# Patient Record
Sex: Female | Born: 1970 | Hispanic: Yes | Marital: Married | Smoking: Never smoker
Health system: Southern US, Community
[De-identification: ages and names within clinical notes are randomized; demographics above are authoritative.]

## PROBLEM LIST (undated history)

## (undated) DIAGNOSIS — M549 Dorsalgia, unspecified: Secondary | ICD-10-CM

## (undated) DIAGNOSIS — A609 Anogenital herpesviral infection, unspecified: Secondary | ICD-10-CM

## (undated) DIAGNOSIS — K219 Gastro-esophageal reflux disease without esophagitis: Secondary | ICD-10-CM

## (undated) DIAGNOSIS — E119 Type 2 diabetes mellitus without complications: Secondary | ICD-10-CM

## (undated) DIAGNOSIS — J329 Chronic sinusitis, unspecified: Secondary | ICD-10-CM

## (undated) DIAGNOSIS — J309 Allergic rhinitis, unspecified: Secondary | ICD-10-CM

## (undated) DIAGNOSIS — F419 Anxiety disorder, unspecified: Secondary | ICD-10-CM

## (undated) DIAGNOSIS — E559 Vitamin D deficiency, unspecified: Secondary | ICD-10-CM

## (undated) DIAGNOSIS — F32A Depression, unspecified: Secondary | ICD-10-CM

## (undated) HISTORY — PX: KNEE SURGERY: SHX244

## (undated) HISTORY — PX: CHOLECYSTECTOMY: SHX55

## (undated) HISTORY — PX: COLONOSCOPY: SHX174

## (undated) HISTORY — PX: FRACTURE SURGERY: SHX138

---

## 2004-07-31 ENCOUNTER — Emergency Department: Payer: Self-pay | Admitting: Emergency Medicine

## 2004-08-02 ENCOUNTER — Ambulatory Visit: Payer: Self-pay | Admitting: General Practice

## 2005-04-27 ENCOUNTER — Emergency Department: Payer: Self-pay | Admitting: Internal Medicine

## 2007-06-05 ENCOUNTER — Ambulatory Visit: Payer: Self-pay | Admitting: Obstetrics and Gynecology

## 2007-06-06 ENCOUNTER — Ambulatory Visit: Payer: Self-pay | Admitting: Obstetrics and Gynecology

## 2007-10-14 ENCOUNTER — Observation Stay: Payer: Self-pay | Admitting: Vascular Surgery

## 2009-02-14 ENCOUNTER — Ambulatory Visit: Payer: Self-pay | Admitting: Gastroenterology

## 2011-03-22 ENCOUNTER — Ambulatory Visit: Payer: Self-pay | Admitting: Obstetrics and Gynecology

## 2011-07-13 ENCOUNTER — Ambulatory Visit: Payer: Self-pay

## 2013-11-10 ENCOUNTER — Ambulatory Visit: Payer: Self-pay | Admitting: Family Medicine

## 2015-02-07 ENCOUNTER — Encounter: Payer: Self-pay | Admitting: *Deleted

## 2015-02-07 NOTE — Discharge Instructions (Signed)

## 2015-02-09 ENCOUNTER — Ambulatory Visit
Admission: RE | Admit: 2015-02-09 | Discharge: 2015-02-09 | Disposition: A | Discharge status: Home / Self Care | Payer: No Typology Code available for payment source | Source: Ambulatory Visit | Attending: Gastroenterology | Admitting: Gastroenterology

## 2015-02-09 ENCOUNTER — Ambulatory Visit: Payer: No Typology Code available for payment source | Admitting: Anesthesiology

## 2015-02-09 ENCOUNTER — Encounter
Admission: RE | Disposition: A | Discharge status: Home / Self Care | Payer: Self-pay | Source: Ambulatory Visit | Attending: Gastroenterology

## 2015-02-09 DIAGNOSIS — K219 Gastro-esophageal reflux disease without esophagitis: Secondary | ICD-10-CM | POA: Insufficient documentation

## 2015-02-09 DIAGNOSIS — F418 Other specified anxiety disorders: Secondary | ICD-10-CM | POA: Insufficient documentation

## 2015-02-09 DIAGNOSIS — Z9049 Acquired absence of other specified parts of digestive tract: Secondary | ICD-10-CM | POA: Diagnosis not present

## 2015-02-09 DIAGNOSIS — J309 Allergic rhinitis, unspecified: Secondary | ICD-10-CM | POA: Diagnosis not present

## 2015-02-09 DIAGNOSIS — Z833 Family history of diabetes mellitus: Secondary | ICD-10-CM | POA: Diagnosis not present

## 2015-02-09 DIAGNOSIS — Z79899 Other long term (current) drug therapy: Secondary | ICD-10-CM | POA: Diagnosis not present

## 2015-02-09 DIAGNOSIS — R1013 Epigastric pain: Secondary | ICD-10-CM | POA: Insufficient documentation

## 2015-02-09 DIAGNOSIS — Z809 Family history of malignant neoplasm, unspecified: Secondary | ICD-10-CM | POA: Diagnosis not present

## 2015-02-09 HISTORY — PX: ESOPHAGOGASTRODUODENOSCOPY: SHX5428

## 2015-02-09 HISTORY — DX: Chronic sinusitis, unspecified: J32.9

## 2015-02-09 HISTORY — DX: Gastro-esophageal reflux disease without esophagitis: K21.9

## 2015-02-09 SURGERY — EGD (ESOPHAGOGASTRODUODENOSCOPY)
Anesthesia: Monitor Anesthesia Care

## 2015-02-09 MED ORDER — LIDOCAINE HCL (CARDIAC) 20 MG/ML IV SOLN
INTRAVENOUS | Status: DC | PRN
Start: 2015-02-09 — End: 2015-02-09
  Administered 2015-02-09: 30 mg via INTRAVENOUS

## 2015-02-09 MED ORDER — ACETAMINOPHEN 325 MG PO TABS: 325.0000 mg | ORAL_TABLET | ORAL | Status: DC | PRN

## 2015-02-09 MED ORDER — ACETAMINOPHEN 160 MG/5ML PO SOLN
325.0000 mg | ORAL | Status: DC | PRN
Start: 2015-02-09 — End: 2015-02-09

## 2015-02-09 MED ORDER — SODIUM CHLORIDE 0.9 % IV SOLN: INTRAVENOUS | Status: DC

## 2015-02-09 MED ORDER — PROPOFOL 10 MG/ML IV BOLUS
INTRAVENOUS | Status: DC | PRN
Start: 2015-02-09 — End: 2015-02-09
  Administered 2015-02-09: 50 mg via INTRAVENOUS
  Administered 2015-02-09: 100 mg via INTRAVENOUS
  Administered 2015-02-09: 10 mg via INTRAVENOUS

## 2015-02-09 MED ORDER — STERILE WATER FOR IRRIGATION IR SOLN
Status: DC | PRN
  Administered 2015-02-09: 11:00:00

## 2015-02-09 MED ORDER — LACTATED RINGERS IV SOLN
INTRAVENOUS | Status: DC
  Administered 2015-02-09: 10:00:00 via INTRAVENOUS

## 2015-02-09 SURGICAL SUPPLY — 41 items
BALLN DILATOR 10-12 8 (BALLOONS)
BALLN DILATOR 12-15 8 (BALLOONS)
BALLN DILATOR 15-18 8 (BALLOONS)
BALLN DILATOR CRE 0-12 8 (BALLOONS)
BALLN DILATOR ESOPH 8 10 CRE (MISCELLANEOUS) IMPLANT
BALLOON DILATOR 12-15 8 (BALLOONS) IMPLANT
BALLOON DILATOR 15-18 8 (BALLOONS) IMPLANT
BALLOON DILATOR CRE 0-12 8 (BALLOONS) IMPLANT
BLOCK BITE 60FR ADLT L/F GRN (MISCELLANEOUS) ×3 IMPLANT
CANISTER SUCT 1200ML W/VALVE (MISCELLANEOUS) ×3 IMPLANT
FCP ESCP3.2XJMB 240X2.8X (MISCELLANEOUS) ×1
FORCEPS BIOP RAD 4 LRG CAP 4 (CUTTING FORCEPS) IMPLANT
FORCEPS BIOP RJ4 240 W/NDL (MISCELLANEOUS) ×2
FORCEPS ESCP3.2XJMB 240X2.8X (MISCELLANEOUS) ×1 IMPLANT
GOWN CVR UNV OPN BCK APRN NK (MISCELLANEOUS) ×1 IMPLANT
GOWN ISOL THUMB LOOP REG UNIV (MISCELLANEOUS) ×2
GOWN STRL REUS W/ TWL LRG LVL3 (GOWN DISPOSABLE) ×1 IMPLANT
GOWN STRL REUS W/TWL LRG LVL3 (GOWN DISPOSABLE) ×2
HEMOCLIP INSTINCT (CLIP) IMPLANT
INJECTOR VARIJECT VIN23 (MISCELLANEOUS) IMPLANT
KIT CO2 TUBING (TUBING) IMPLANT
KIT DEFENDO VALVE AND CONN (KITS) IMPLANT
KIT ENDO PROCEDURE OLY (KITS) ×3 IMPLANT
LIGATOR MULTIBAND 6SHOOTER MBL (MISCELLANEOUS) IMPLANT
MARKER SPOT ENDO TATTOO 5ML (MISCELLANEOUS) IMPLANT
PAD GROUND ADULT SPLIT (MISCELLANEOUS) IMPLANT
SNARE SHORT THROW 13M SML OVAL (MISCELLANEOUS) IMPLANT
SNARE SHORT THROW 30M LRG OVAL (MISCELLANEOUS) IMPLANT
SPOT EX ENDOSCOPIC TATTOO (MISCELLANEOUS)
SUCTION POLY TRAP 4CHAMBER (MISCELLANEOUS) IMPLANT
SYR INFLATION 60ML (SYRINGE) IMPLANT
TRAP SUCTION POLY (MISCELLANEOUS) IMPLANT
TUBING CONN 6MMX3.1M (TUBING)
TUBING SUCTION CONN 0.25 STRL (TUBING) IMPLANT
UNDERPAD 30X60 958B10 (PK) (MISCELLANEOUS) IMPLANT
VALVE BIOPSY ENDO (VALVE) IMPLANT
VARIJECT INJECTOR VIN23 (MISCELLANEOUS)
WATER AUXILLARY (MISCELLANEOUS) IMPLANT
WATER STERILE IRR 250ML POUR (IV SOLUTION) ×3 IMPLANT
WATER STERILE IRR 500ML POUR (IV SOLUTION) IMPLANT
WIRE CRE 18-20MM 8CM F G (MISCELLANEOUS) IMPLANT

## 2015-02-09 NOTE — Anesthesia Procedure Notes (Signed)
Procedure Name: MAC Performed by: Arlice ColtBURNETT, Kemi Gell Pre-anesthesia Checklist: Patient identified, Emergency Drugs available, Suction available, Patient being monitored and Timeout performed Patient Re-evaluated:Patient Re-evaluated prior to inductionOxygen Delivery Method: Nasal cannula Preoxygenation: Pre-oxygenation with 100% oxygen Placement Confirmation: positive ETCO2 and breath sounds checked- equal and bilateral

## 2015-02-09 NOTE — H&P (Signed)
  Date of Initial H&P: 02/03/2015  History reviewed, patient examined, no change in status, stable for surgery. 

## 2015-02-09 NOTE — Op Note (Signed)
[facility] Gastroenterology Patient Name: Breanna Dennis Procedure Date: 02/09/2015 10:40 AM MRN: [id number] Account #: [id number] Date of Birth: 03/04/1971 Admit Type: Outpatient Age: 44 Room: [facility] OR ROOM 01 Gender: Female Note Status: Finalized Procedure:         Upper GI endoscopy Indications:       Epigastric abdominal pain Providers:         Ezzard Standing. Bluford Kaufmann, MD Medicines:         Monitored Anesthesia Care Complications:     No immediate complications. Procedure:         Pre-Anesthesia Assessment:                    - Prior to the procedure, a History and Physical was                     performed, and patient medications, allergies and                     sensitivities were reviewed. The patient's tolerance of                     previous anesthesia was reviewed.                    - The risks and benefits of the procedure and the sedation                     options and risks were discussed with the patient. All                     questions were answered and informed consent was obtained.                    - After reviewing the risks and benefits, the patient was                     deemed in satisfactory condition to undergo the procedure.                    After obtaining informed consent, the endoscope was passed                     under direct vision. Throughout the procedure, the                     patient's blood pressure, pulse, and oxygen saturations                     were monitored continuously. The was introduced through                     the mouth, and advanced to the second part of duodenum.                     The upper GI endoscopy was accomplished without                     difficulty. The patient tolerated the procedure well. Findings:      The examined esophagus was normal.      The entire examined stomach was normal. Biopsies were taken with a cold       forceps for histology.      The examined duodenum was normal. Impression:         -  Normal esophagus.                    - Normal stomach. Biopsied.                    - Normal examined duodenum. Recommendation:    - Discharge patient to home.                    - Observe patient's clinical course.                    - Continue present medications.                    - Await pathology results.                    - The findings and recommendations were discussed with the                     patient. Procedure Code(s): --- Professional ---                    [id number], Esophagogastroduodenoscopy, flexible, transoral;                     with biopsy, single or multiple Diagnosis Code(s): --- Professional ---                    R10.13, Epigastric pain CPT copyright 2014 American Medical Association. All rights reserved. The codes documented in this report are preliminary and upon coder review may  be revised to meet current compliance requirements. Wallace CullensPaul Y Aasiya Creasey, MD 02/09/2015 11:01:34 AM This report has been signed electronically. Number of Addenda: 0 Note Initiated On: 02/09/2015 10:40 AM      [facility]

## 2015-02-09 NOTE — Transfer of Care (Signed)
Immediate Anesthesia Transfer of Care Note  Patient: Breanna [facility]unneryMaria Dennis [facility]  Procedure(s) Performed: Procedure(s) with comments: ESOPHAGOGASTRODUODENOSCOPY (EGD) (N/A) - Spanish interpreter interpreter has been requested  Patient Location: PACU  Anesthesia Type: MAC  Level of Consciousness: awake, alert  and patient cooperative  Airway and Oxygen Therapy: Patient Spontanous Breathing and Patient connected to supplemental oxygen  Post-op Assessment: Post-op Vital signs reviewed, Patient's Cardiovascular Status Stable, Respiratory Function Stable, Patent Airway and No signs of Nausea or vomiting  Post-op Vital Signs: Reviewed and stable  Complications: No apparent anesthesia complications

## 2015-02-09 NOTE — Anesthesia Postprocedure Evaluation (Signed)
  Anesthesia Post-op Note  Patient: [facility]Maria [facility]  Procedure(s) Performed: Procedure(s) with comments: ESOPHAGOGASTRODUODENOSCOPY (EGD) (N/A) - Spanish interpreter interpreter has been requested  Anesthesia type:MAC  Patient location: PACU  Post pain: Pain level controlled  Post assessment: Post-op Vital signs reviewed, Patient's Cardiovascular Status Stable, Respiratory Function Stable, Patent Airway and No signs of Nausea or vomiting  Post vital signs: Reviewed and stable  Last Vitals:  Filed Vitals:   02/09/15 1107  BP: 110/78  Pulse: 69  Temp: 36.5 C  Resp: 18    Level of consciousness: awake, alert  and patient cooperative  Complications: No apparent anesthesia complications

## 2015-02-09 NOTE — Anesthesia Preprocedure Evaluation (Signed)
Anesthesia Evaluation  Patient identified by MRN, date of birth, ID band  Reviewed: Allergy & Precautions, H&P , NPO status , Patient's Chart, lab work & pertinent test results  Airway Mallampati: II  TM Distance: >3 FB Neck ROM: full    Dental no notable dental hx.    Pulmonary    Pulmonary exam normal        Cardiovascular  Rhythm:regular Rate:Normal     Neuro/Psych    GI/Hepatic GERD  ,  Endo/Other    Renal/GU      Musculoskeletal   Abdominal   Peds  Hematology   Anesthesia Other Findings   Reproductive/Obstetrics                             Anesthesia Physical Anesthesia Plan  ASA: II  Anesthesia Plan: MAC   Post-op Pain Management:    Induction:   Airway Management Planned:   Additional Equipment:   Intra-op Plan:   Post-operative Plan:   Informed Consent: I have reviewed the patients History and Physical, chart, labs and discussed the procedure including the risks, benefits and alternatives for the proposed anesthesia with the patient or authorized representative who has indicated his/her understanding and acceptance.     Plan Discussed with: CRNA  Anesthesia Plan Comments:         Anesthesia Quick Evaluation  

## 2015-02-10 ENCOUNTER — Encounter: Payer: Self-pay | Admitting: Gastroenterology

## 2015-02-11 LAB — SURGICAL PATHOLOGY

## 2015-04-05 ENCOUNTER — Other Ambulatory Visit: Payer: Self-pay | Admitting: Nurse Practitioner

## 2015-04-05 DIAGNOSIS — R1013 Epigastric pain: Secondary | ICD-10-CM

## 2015-04-08 ENCOUNTER — Ambulatory Visit: Admission: RE | Admit: 2015-04-08 | Payer: Self-pay | Source: Ambulatory Visit

## 2015-04-14 ENCOUNTER — Ambulatory Visit
Admission: RE | Admit: 2015-04-14 | Discharge: 2015-04-14 | Disposition: A | Discharge status: Home / Self Care | Payer: Commercial Managed Care - HMO | Source: Ambulatory Visit | Attending: Nurse Practitioner | Admitting: Nurse Practitioner

## 2015-04-14 DIAGNOSIS — K219 Gastro-esophageal reflux disease without esophagitis: Secondary | ICD-10-CM | POA: Insufficient documentation

## 2015-04-14 DIAGNOSIS — R1013 Epigastric pain: Secondary | ICD-10-CM | POA: Diagnosis not present

## 2015-04-14 DIAGNOSIS — R935 Abnormal findings on diagnostic imaging of other abdominal regions, including retroperitoneum: Secondary | ICD-10-CM | POA: Insufficient documentation

## 2015-04-14 MED ORDER — IOHEXOL 300 MG/ML  SOLN
100.0000 mL | Freq: Once | INTRAMUSCULAR | Status: AC | PRN
  Administered 2015-04-14: 85 mL via INTRAVENOUS

## 2018-09-24 ENCOUNTER — Other Ambulatory Visit: Payer: Self-pay | Admitting: Certified Nurse Midwife

## 2018-09-24 DIAGNOSIS — Z1231 Encounter for screening mammogram for malignant neoplasm of breast: Secondary | ICD-10-CM

## 2018-10-04 ENCOUNTER — Other Ambulatory Visit: Payer: Self-pay

## 2018-10-04 ENCOUNTER — Emergency Department
Admission: EM | Admit: 2018-10-04 | Discharge: 2018-10-04 | Disposition: A | Discharge status: Home / Self Care | Payer: BLUE CROSS/BLUE SHIELD | Attending: Emergency Medicine | Admitting: Emergency Medicine

## 2018-10-04 ENCOUNTER — Emergency Department: Payer: BLUE CROSS/BLUE SHIELD

## 2018-10-04 ENCOUNTER — Encounter: Payer: Self-pay | Admitting: Emergency Medicine

## 2018-10-04 DIAGNOSIS — M25551 Pain in right hip: Secondary | ICD-10-CM

## 2018-10-04 DIAGNOSIS — M25552 Pain in left hip: Secondary | ICD-10-CM | POA: Diagnosis not present

## 2018-10-04 DIAGNOSIS — Y929 Unspecified place or not applicable: Secondary | ICD-10-CM | POA: Insufficient documentation

## 2018-10-04 DIAGNOSIS — Z79899 Other long term (current) drug therapy: Secondary | ICD-10-CM | POA: Insufficient documentation

## 2018-10-04 DIAGNOSIS — Y9301 Activity, walking, marching and hiking: Secondary | ICD-10-CM | POA: Insufficient documentation

## 2018-10-04 DIAGNOSIS — Y998 Other external cause status: Secondary | ICD-10-CM | POA: Diagnosis not present

## 2018-10-04 DIAGNOSIS — W010XXA Fall on same level from slipping, tripping and stumbling without subsequent striking against object, initial encounter: Secondary | ICD-10-CM | POA: Diagnosis not present

## 2018-10-04 DIAGNOSIS — M545 Low back pain, unspecified: Secondary | ICD-10-CM

## 2018-10-04 LAB — POCT PREGNANCY, URINE: Preg Test, Ur: NEGATIVE

## 2018-10-04 MED ORDER — ORPHENADRINE CITRATE 30 MG/ML IJ SOLN
60.0000 mg | Freq: Two times a day (BID) | INTRAMUSCULAR | Status: DC
  Administered 2018-10-04: 60 mg via INTRAMUSCULAR
  Filled 2018-10-04: qty 2

## 2018-10-04 MED ORDER — LIDOCAINE 5 % EX PTCH
1.0000 | MEDICATED_PATCH | CUTANEOUS | Status: DC
  Administered 2018-10-04: 1 via TRANSDERMAL
  Filled 2018-10-04: qty 1

## 2018-10-04 MED ORDER — TRAMADOL HCL 50 MG PO TABS: 50.0000 mg | ORAL_TABLET | Freq: Four times a day (QID) | ORAL | 0 refills | Status: AC | PRN

## 2018-10-04 MED ORDER — IBUPROFEN 600 MG PO TABS: 600.0000 mg | ORAL_TABLET | Freq: Three times a day (TID) | ORAL | 0 refills | Status: AC | PRN

## 2018-10-04 MED ORDER — HYDROMORPHONE HCL 1 MG/ML IJ SOLN
1.0000 mg | Freq: Once | INTRAMUSCULAR | Status: AC
  Administered 2018-10-04: 1 mg via INTRAMUSCULAR
  Filled 2018-10-04: qty 1

## 2018-10-04 MED ORDER — CYCLOBENZAPRINE HCL 10 MG PO TABS: 10.0000 mg | ORAL_TABLET | Freq: Three times a day (TID) | ORAL | 0 refills | Status: AC | PRN

## 2018-10-04 NOTE — ED Provider Notes (Signed)
[facility] Emergency Department Provider Note   ____________________________________________   First MD Initiated Contact with Patient 10/04/18 1446     (approximate)  I have reviewed the triage vital signs and the nursing notes.   HISTORY  Chief Complaint Back Pain    HPI: Via interpreter Breanna Dennis Breanna Dennis is a 48 y.o. female patient presents with acute back pain secondary to a slip and fall.  Patient states pain in bilateral hips and coccyx area.  Patient denies radicular component to her back pain.  Patient denies bladder bowel dysfunction.  Patient rates the pain is 8/10.  Patient appears to be in moderate distress.  No palliative measures for complaint.         Past Medical History:  Diagnosis Date  . GERD (gastroesophageal reflux disease)   . Sinusitis     There are no active problems to display for this patient.   Past Surgical History:  Procedure Laterality Date  . CHOLECYSTECTOMY    . COLONOSCOPY    . ESOPHAGOGASTRODUODENOSCOPY N/A 02/09/2015   Procedure: ESOPHAGOGASTRODUODENOSCOPY (EGD);  Surgeon: Hulen Luster, MD;  Location: [place];  Service: Gastroenterology;  Laterality: N/A;  Spanish interpreter interpreter has been requested  . FRACTURE SURGERY      Prior to Admission medications   Medication Sig Start Date End Date Taking? Authorizing Provider  cyclobenzaprine (FLEXERIL) 10 MG tablet Take 1 tablet (10 mg total) by mouth 3 (three) times daily as needed. 10/04/18   Sable Feil, PA-C  ibuprofen (ADVIL) 600 MG tablet Take 1 tablet (600 mg total) by mouth every 8 (eight) hours as needed. 10/04/18   Sable Feil, PA-C  Multiple Vitamin (MULTIVITAMIN) tablet Take 1 tablet by mouth daily.    [provider]  omeprazole (PRILOSEC) 20 MG capsule Take 20 mg by mouth daily. Lunch time    [provider]  Probiotic Product (PROBIOTIC DAILY PO) Take by mouth.    [provider]  traMADol  (ULTRAM) 50 MG tablet Take 1 tablet (50 mg total) by mouth every 6 (six) hours as needed. 10/04/18 10/04/19  Sable Feil, PA-C    Allergies Patient has no known allergies.  No family history on file.  Social History Social History   Tobacco Use  . Smoking status: Never Smoker  Substance Use Topics  . Alcohol use: No  . Drug use: Not on file    Review of Systems Constitutional: No fever/chills Eyes: No visual changes. ENT: No sore throat. Cardiovascular: Denies chest pain. Respiratory: Denies shortness of breath. Gastrointestinal: No abdominal pain.  No nausea, no vomiting.  No diarrhea.  No constipation. Genitourinary: Negative for dysuria. Musculoskeletal: Positive for back and hip pain Skin: Negative for rash. Neurological: Negative for headaches, focal weakness or numbness.  ____________________________________________   PHYSICAL EXAM:  VITAL SIGNS: ED Triage Vitals  Enc Vitals Group     BP 10/04/18 1323 125/78     Pulse Rate 10/04/18 1323 66     Resp 10/04/18 1323 18     Temp 10/04/18 1323 98.5 F (36.9 C)     Temp Source 10/04/18 1323 Oral     SpO2 10/04/18 1323 97 %     Weight 10/04/18 1326 180 lb (81.6 kg)     Height 10/04/18 1326 5\' 5"  (1.651 m)     Head Circumference --      Peak Flow --      Pain Score 10/04/18 1325 8  Pain Loc --      Pain Edu? --      Excl. in GC? --     Constitutional: Alert and oriented.  Moderate distress.   Neck: No stridor.  No cervical spine tenderness to palpation. Hematological/Lymphatic/Immunilogical: No cervical lymphadenopathy. Cardiovascular: Normal rate, regular rhythm. Grossly normal heart sounds.  Good peripheral circulation. Respiratory: Normal respiratory effort.  No retractions. Lungs CTAB. Gastrointestinal: Soft and nontender. No distention. No abdominal bruits. No CVA tenderness. Genitourinary: Deferred Musculoskeletal: No obvious spinal deformity.  Moderate guarding palpation of L3-S1.   Neurologic:   Normal speech and language. No gross focal neurologic deficits are appreciated. No gait instability. Skin:  Skin is warm, dry and intact. No rash noted. Psychiatric: Mood and affect are normal. Speech and behavior are normal.  ____________________________________________   LABS (all labs ordered are listed, but only abnormal results are displayed)  Labs Reviewed  POC URINE PREG, ED  POCT PREGNANCY, URINE   ____________________________________________  EKG   ____________________________________________  RADIOLOGY  ED MD interpretation:    Official radiology report(s): Dg Lumbar Spine 2-3 Views  Result Date: 10/04/2018 CLINICAL DATA:  Low back pain following fall, initial encounter EXAM: LUMBAR SPINE - 3 VIEW COMPARISON:  None. FINDINGS: Five lumbar type vertebral bodies are well visualized. Vertebral body height is well maintained. Mild osteophytic changes are seen. No anterolisthesis is noted. No soft tissue changes are seen. IMPRESSION: Mild degenerative change without acute abnormality. Electronically Signed   By: Alcide CleverMark  Lukens M.D.   On: 10/04/2018 16:26   Dg Hip Unilat W Or Wo Pelvis 2-3 Views Left  Result Date: 10/04/2018 CLINICAL DATA:  Left hip pain following fall, initial encounter EXAM: DG HIP (WITH OR WITHOUT PELVIS) 3V LEFT COMPARISON:  None. FINDINGS: Pelvic ring is intact. No acute fracture or dislocation is noted. No soft tissue abnormality is seen. IMPRESSION: No acute abnormality noted. Electronically Signed   By: Alcide CleverMark  Lukens M.D.   On: 10/04/2018 16:32   Dg Hip Unilat W Or Wo Pelvis 2-3 Views Right  Result Date: 10/04/2018 CLINICAL DATA:  Recent fall with right hip pain, initial encounter EXAM: DG HIP (WITH OR WITHOUT PELVIS) 3V RIGHT COMPARISON:  None. FINDINGS: Pelvic ring as visualized is within normal limits. No acute fracture or dislocation is seen. No soft tissue abnormality is noted. IMPRESSION: No acute abnormality noted. Electronically Signed   By: Alcide CleverMark   Lukens M.D.   On: 10/04/2018 16:28    ____________________________________________   PROCEDURES  Procedure(s) performed (including Critical Care):  Procedures   ____________________________________________   INITIAL IMPRESSION / ASSESSMENT AND PLAN / ED COURSE  As part of my medical decision making, I reviewed the following data within the electronic MEDICAL RECORD NUMBER         Breanna Dennis was evaluated in Emergency Department on 10/04/2018 for the symptoms described in the history of present illness. She was evaluated in the context of the global COVID-19 pandemic, which necessitated consideration that the patient might be at risk for infection with the SARS-CoV-2 virus that causes COVID-19. Institutional protocols and algorithms that pertain to the evaluation of patients at risk for COVID-19 are in a state of rapid change based on information released by regulatory bodies including the CDC and federal and state organizations. These policies and algorithms were followed during the patient's care in the ED.   Patient complain of low back pain secondary to fall.  Further evaluation with x-rays shows no acute abnormalities.  Patient given discharge care  instructions advised take medication as directed.  [facility]      ____________________________________________   FINAL CLINICAL IMPRESSION(S) / ED DIAGNOSES  Final diagnoses:  Acute midline low back pain without sciatica  Pain of both hip joints     ED Discharge Orders         Ordered    traMADol (ULTRAM) 50 MG tablet  Every 6 hours PRN     10/04/18 1640    cyclobenzaprine (FLEXERIL) 10 MG tablet  3 times daily PRN     10/04/18 1640    ibuprofen (ADVIL) 600 MG tablet  Every 8 hours PRN     10/04/18 1640           Note:  This document was prepared using Dragon voice recognition software and may include unintentional dictation errors.    Joni ReiningSmith, Shirlette Scarber  K, PA-C 10/04/18 1653    Shaune PollackIsaacs, [place], MD 10/05/18 [phone]

## 2018-10-04 NOTE — ED Triage Notes (Signed)
Low back pain since fall this am.

## 2018-10-04 NOTE — Discharge Instructions (Addendum)
Follow discharge care instruction take medication as directed. °

## 2018-11-03 ENCOUNTER — Ambulatory Visit
Admission: RE | Admit: 2018-11-03 | Discharge: 2018-11-03 | Disposition: A | Discharge status: Home / Self Care | Payer: BLUE CROSS/BLUE SHIELD | Source: Ambulatory Visit | Attending: Certified Nurse Midwife | Admitting: Certified Nurse Midwife

## 2018-11-03 DIAGNOSIS — Z1231 Encounter for screening mammogram for malignant neoplasm of breast: Secondary | ICD-10-CM | POA: Insufficient documentation

## 2018-11-10 ENCOUNTER — Other Ambulatory Visit: Payer: Self-pay | Admitting: Certified Nurse Midwife

## 2018-11-10 DIAGNOSIS — R921 Mammographic calcification found on diagnostic imaging of breast: Secondary | ICD-10-CM

## 2018-11-10 DIAGNOSIS — R928 Other abnormal and inconclusive findings on diagnostic imaging of breast: Secondary | ICD-10-CM

## 2019-01-06 ENCOUNTER — Ambulatory Visit
Admission: RE | Admit: 2019-01-06 | Discharge: 2019-01-06 | Disposition: A | Discharge status: Home / Self Care | Payer: BLUE CROSS/BLUE SHIELD | Source: Ambulatory Visit | Attending: Certified Nurse Midwife | Admitting: Certified Nurse Midwife

## 2019-01-06 DIAGNOSIS — R921 Mammographic calcification found on diagnostic imaging of breast: Secondary | ICD-10-CM

## 2019-01-06 DIAGNOSIS — R928 Other abnormal and inconclusive findings on diagnostic imaging of breast: Secondary | ICD-10-CM | POA: Diagnosis not present

## 2019-03-12 ENCOUNTER — Other Ambulatory Visit: Payer: Self-pay | Admitting: Gerontology

## 2019-03-12 DIAGNOSIS — N930 Postcoital and contact bleeding: Secondary | ICD-10-CM

## 2019-03-18 ENCOUNTER — Ambulatory Visit
Admission: RE | Admit: 2019-03-18 | Discharge: 2019-03-18 | Disposition: A | Discharge status: Home / Self Care | Payer: BLUE CROSS/BLUE SHIELD | Source: Ambulatory Visit | Attending: Gerontology | Admitting: Gerontology

## 2019-03-18 ENCOUNTER — Other Ambulatory Visit: Payer: Self-pay

## 2019-03-18 DIAGNOSIS — N930 Postcoital and contact bleeding: Secondary | ICD-10-CM | POA: Diagnosis present

## 2019-10-19 IMAGING — MG DIGITAL DIAGNOSTIC UNILAT LEFT W/ CAD
4 series · 4 of 8 positions shown · non-contrast
Comparison: Previous exam(s).

CLINICAL DATA: Recall from screening mammogram for questioned
calcifications in the left breast

EXAM:
DIGITAL DIAGNOSTIC LEFT MAMMOGRAM WITH TOMO

[L ML]
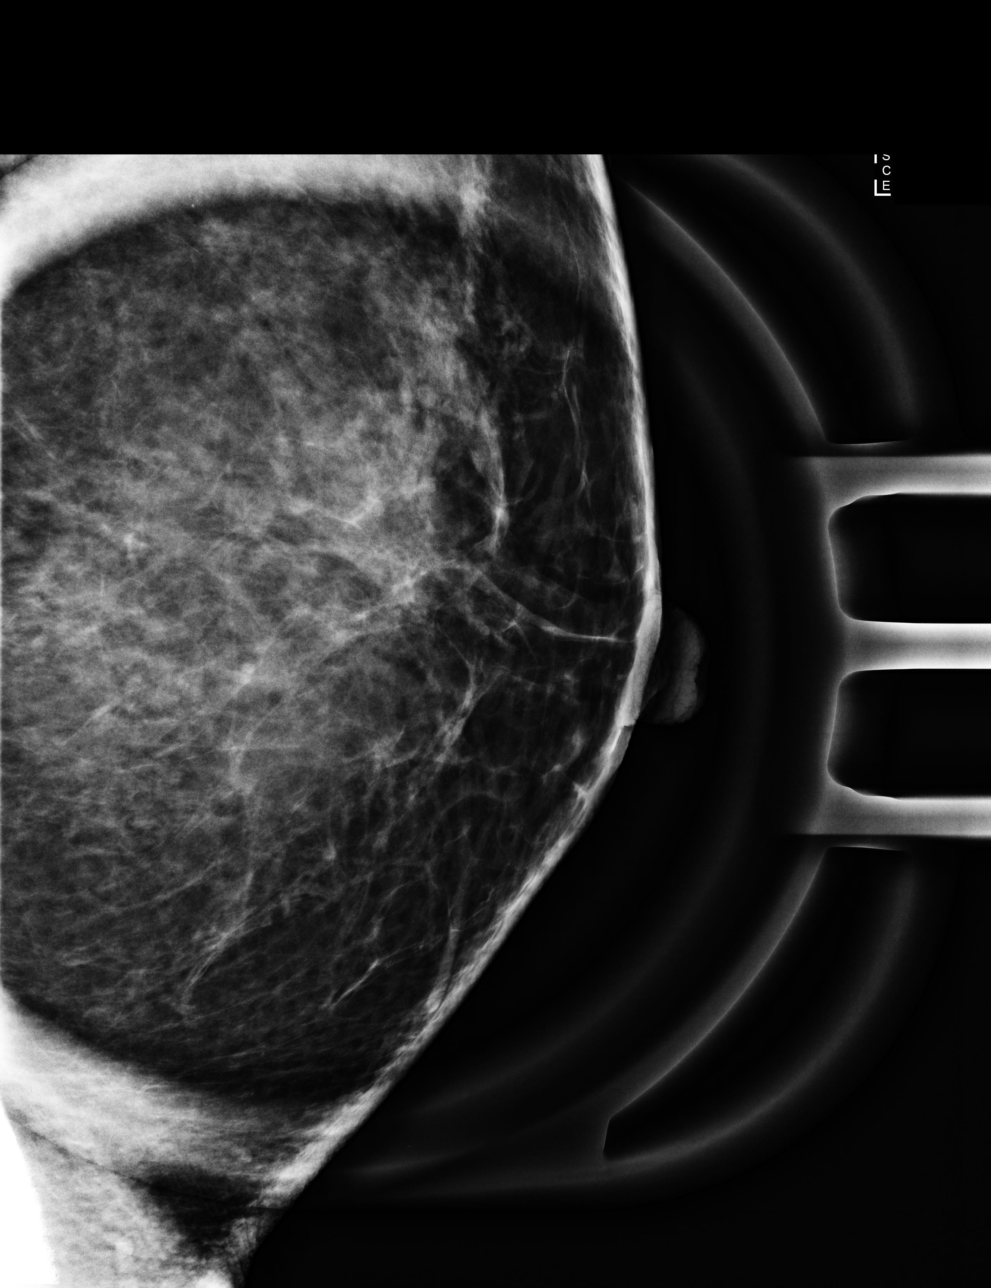

[L CC]
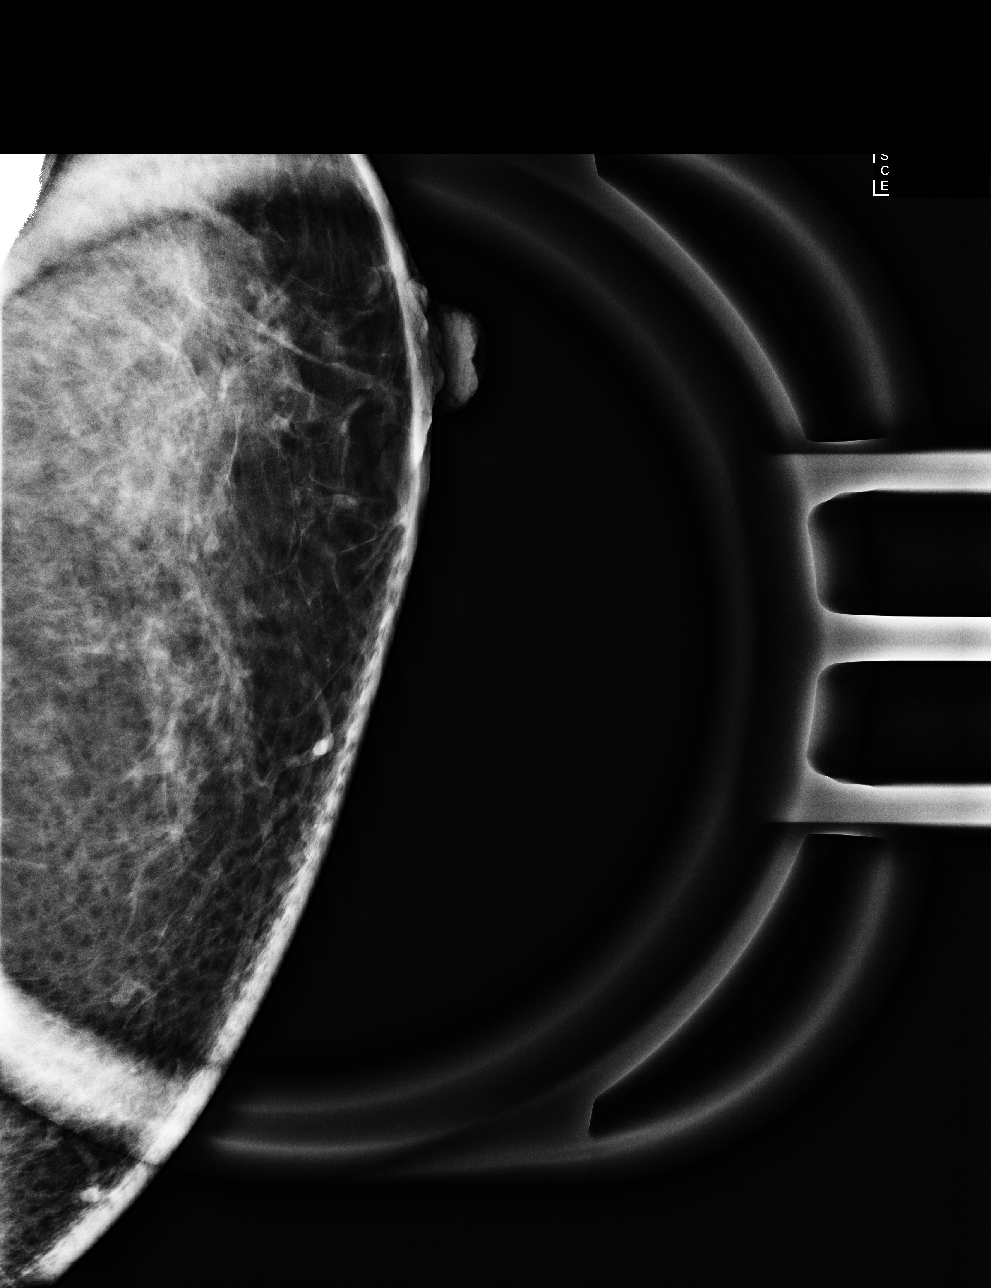

[L ML synth-2D]
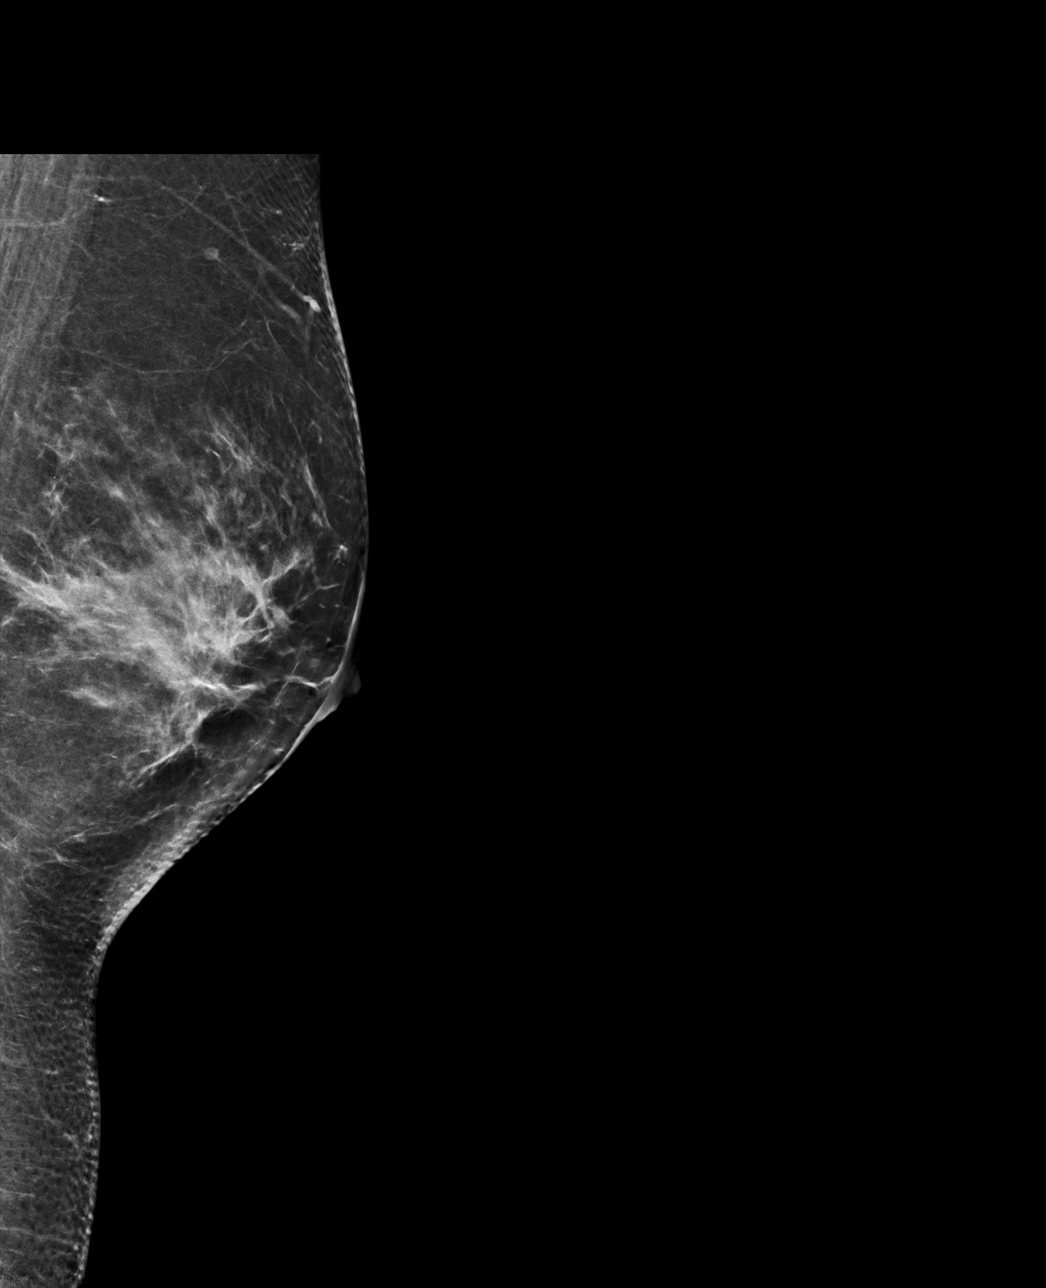

[L ML tomo · tomo slice 39/76.0]
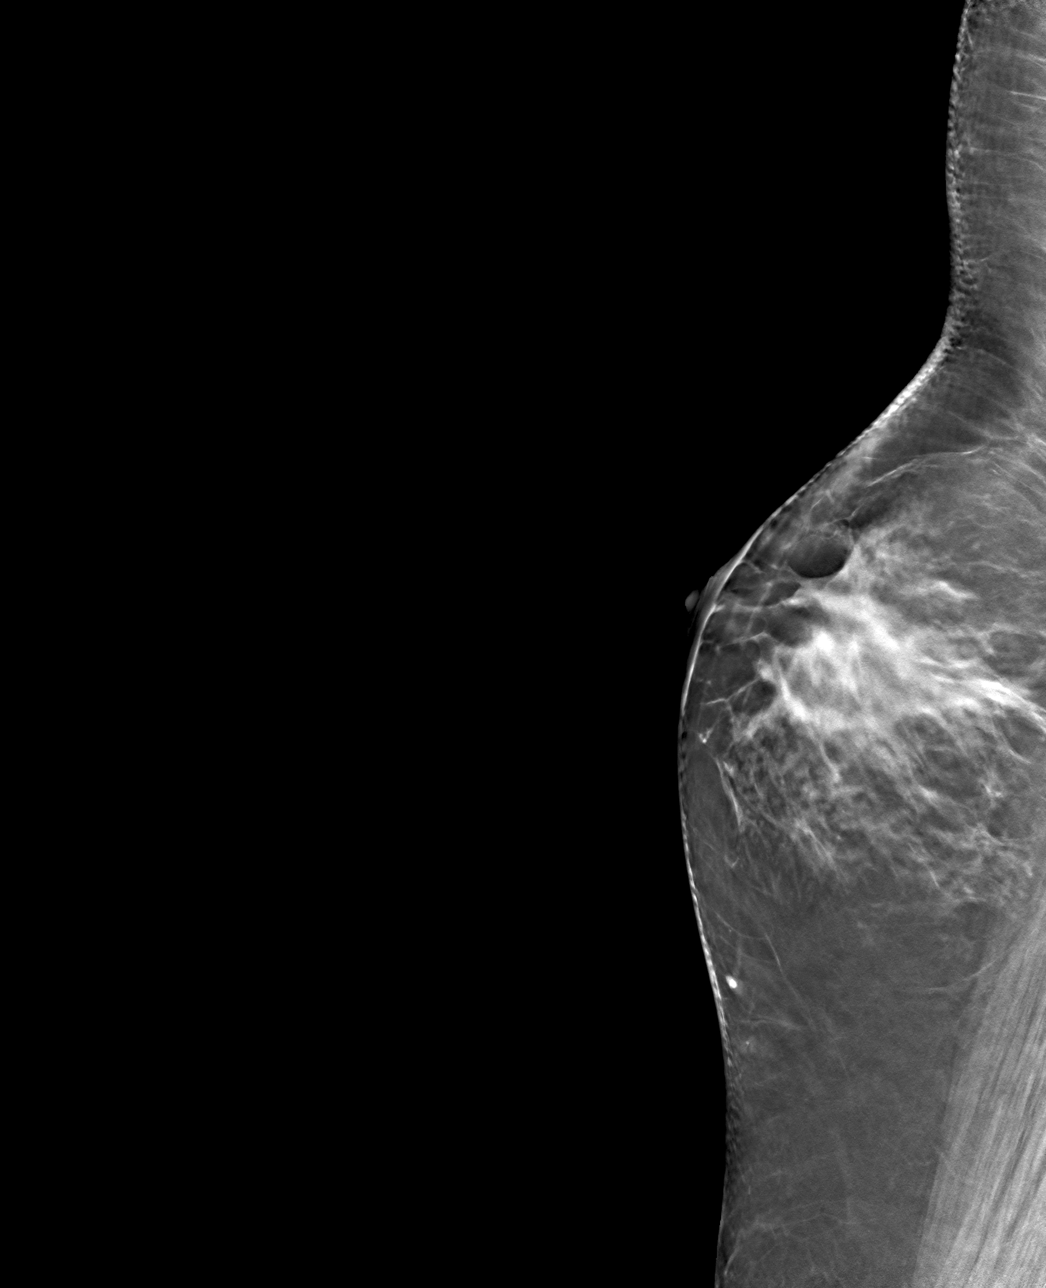

[4 of 8 positions shown; findings below may reference images not displayed]

ACR Breast Density Category c: The breast tissue is heterogeneously
dense, which may obscure small masses.
FINDINGS: Additional views including magnification do not demonstrate any
suspicious microcalcifications in the left breast. No suspicious
mass, microcalcification, or other finding is identified.
IMPRESSION: No evidence of malignancy in the left breast.

RECOMMENDATION:
Recommend routine annual screening mammogram in October 2019.

I have discussed the findings and recommendations with the patient.
If applicable, a reminder letter will be sent to the patient
regarding the next appointment.

BI-RADS CATEGORY  1: Negative.

## 2019-12-29 IMAGING — US US PELVIS COMPLETE WITH TRANSVAGINAL
1 series · 14 of 24 positions shown · non-contrast
Comparison: None

CLINICAL DATA: Bleeding after intercourse; last menstrual period
was in 8608



[Series 1: us pelvis complete with transvaginal · 14 of 24 slices shown]
[im 1/24]
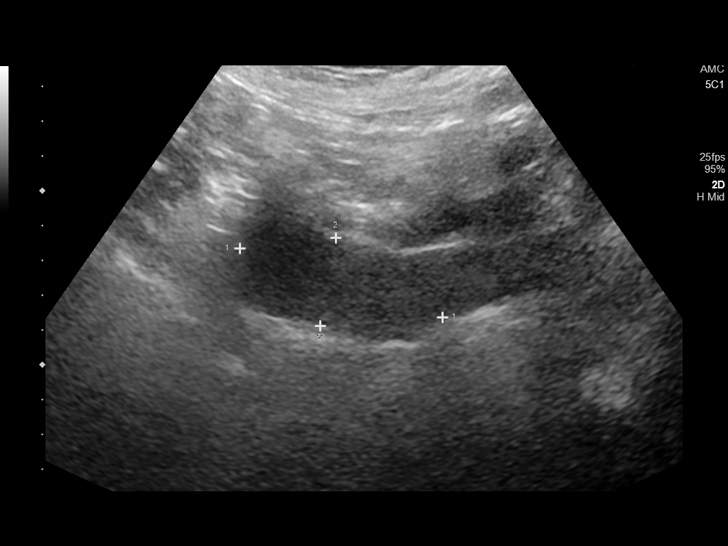
[im 3/24]
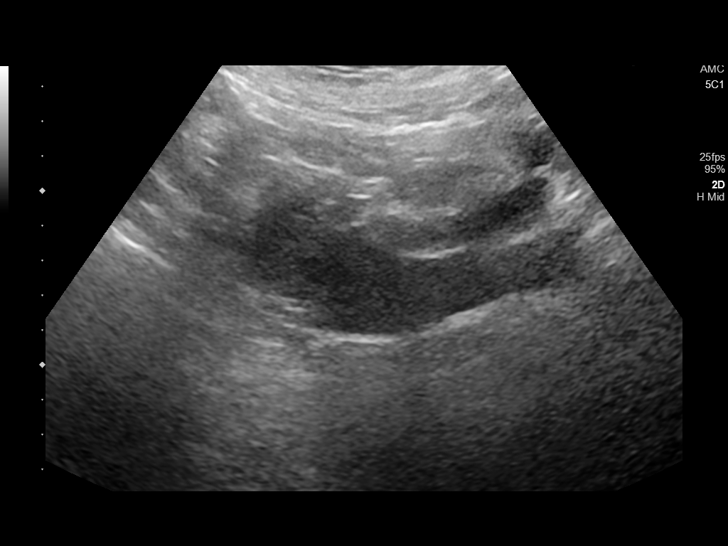
[im 5/24]
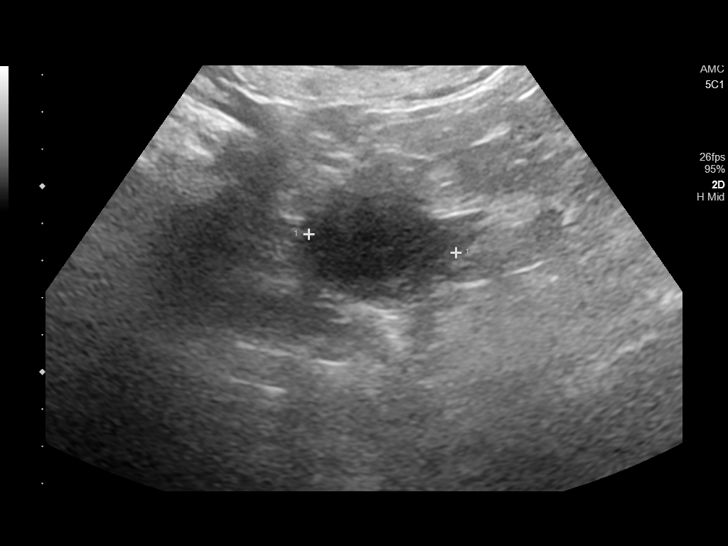
[im 7/24]
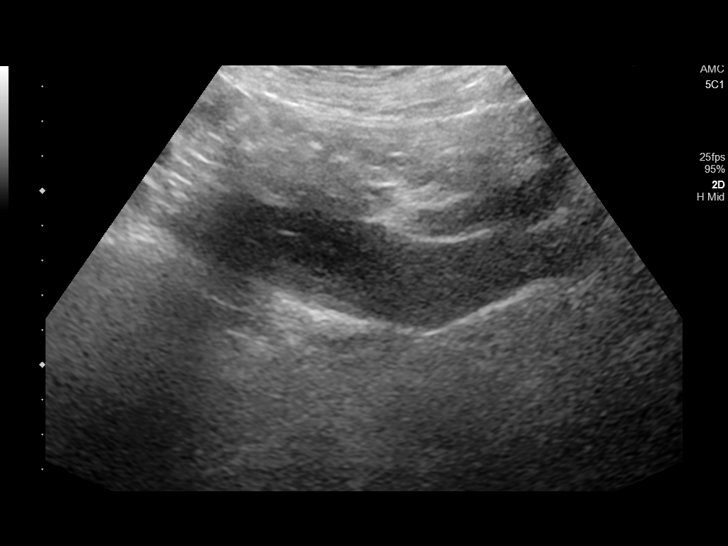
[im 8/24]
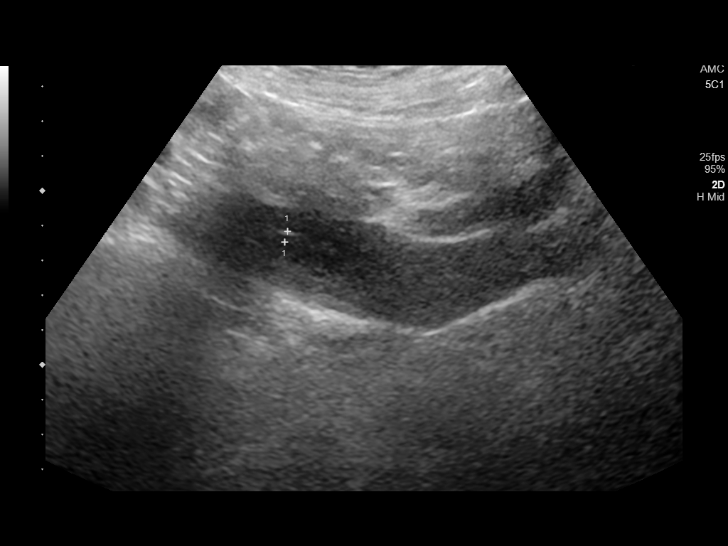
[im 10/24]
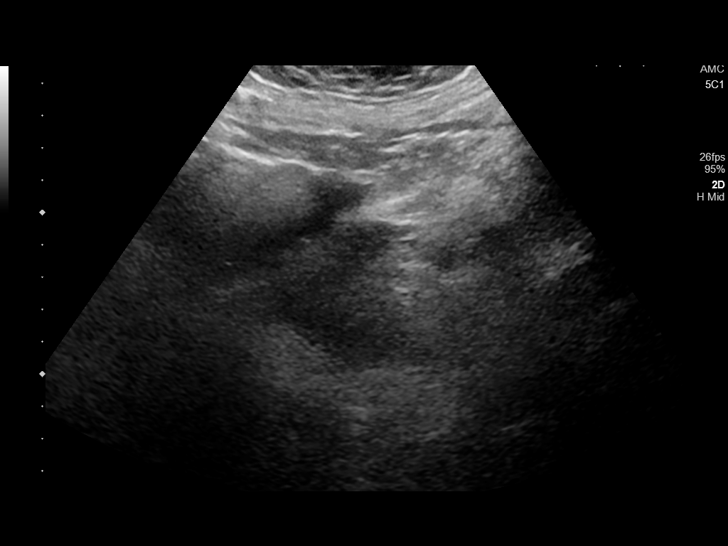
[im 12/24]
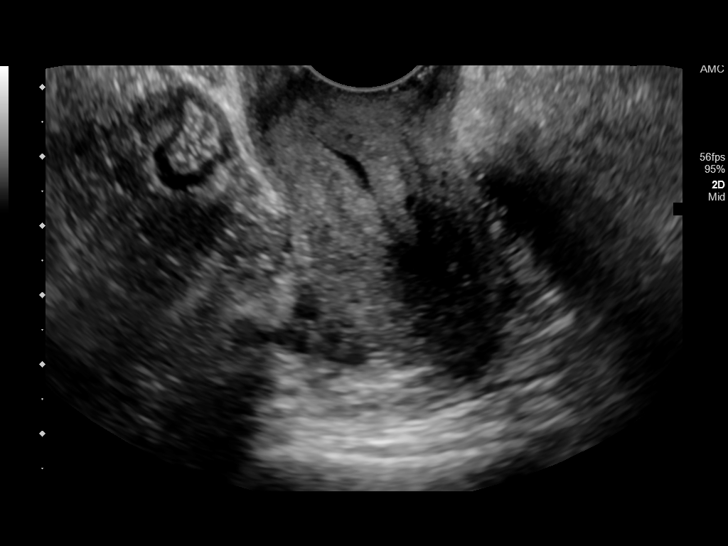
[im 13/24]
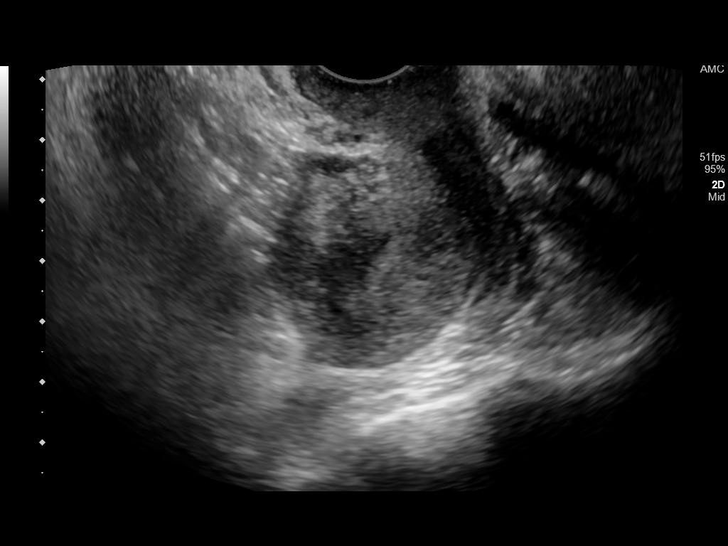
[im 15/24]
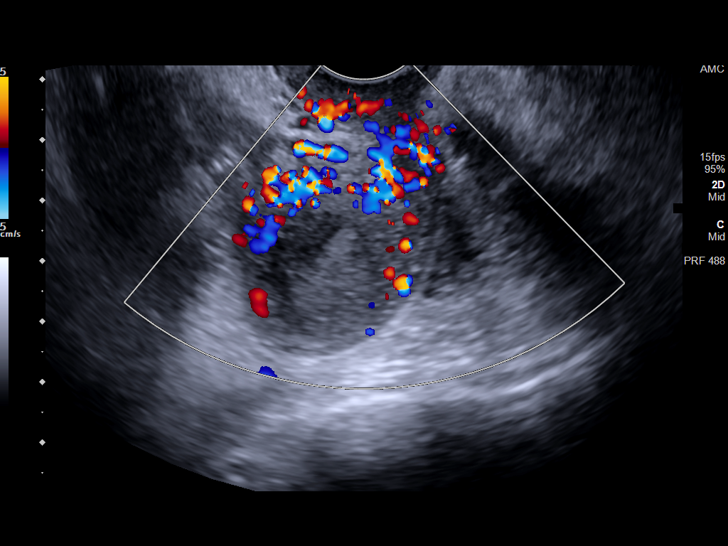
[im 17/24]
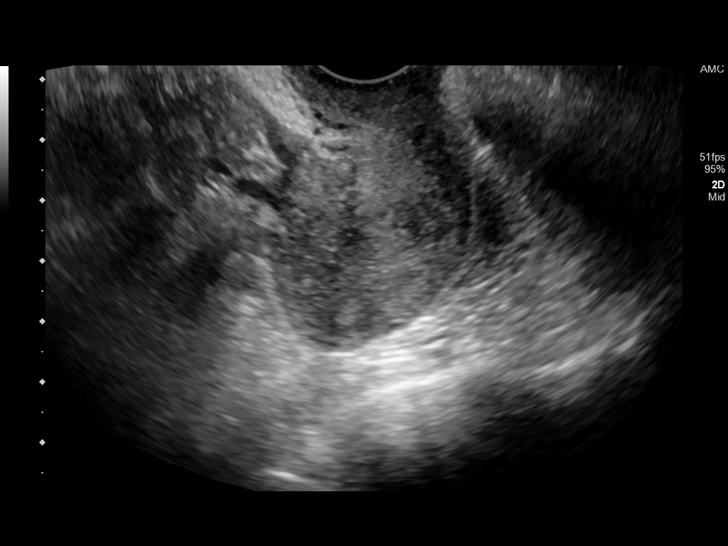
[im 19/24]
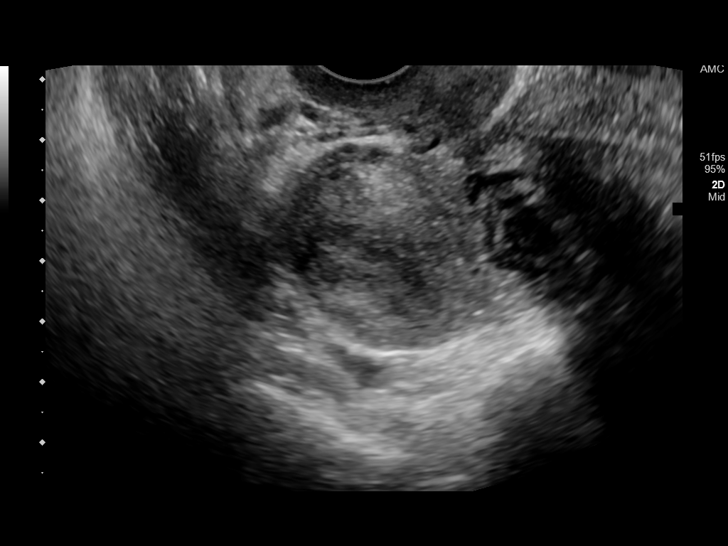
[im 20/24]
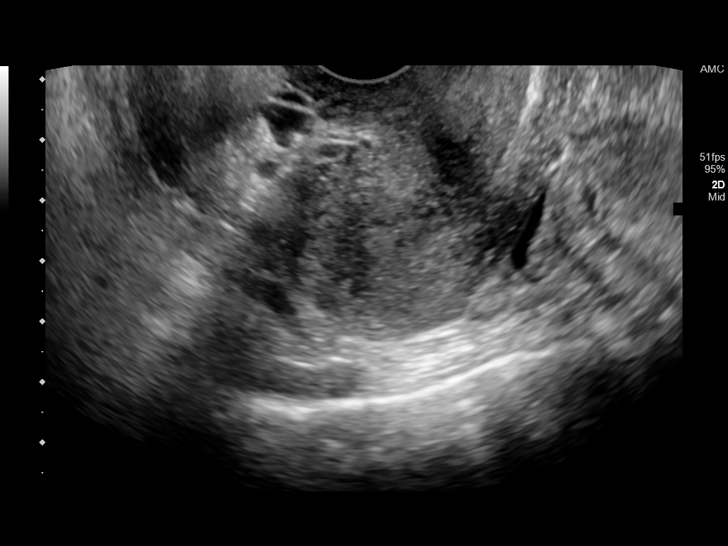
[im 22/24]
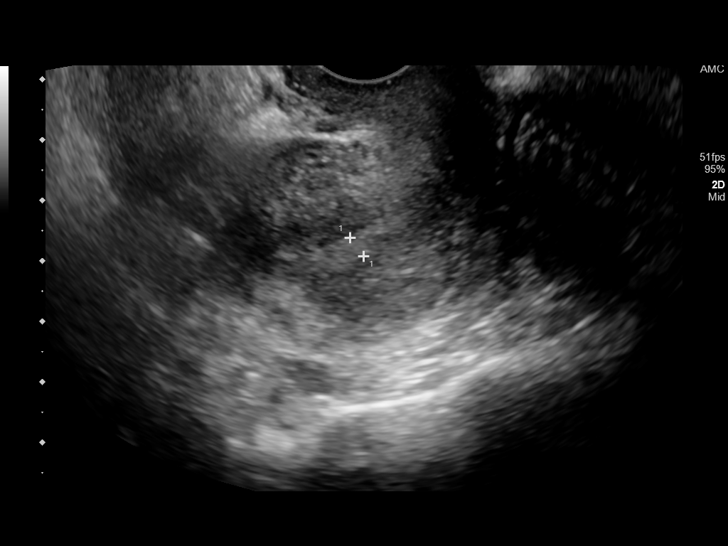
[im 24/24]
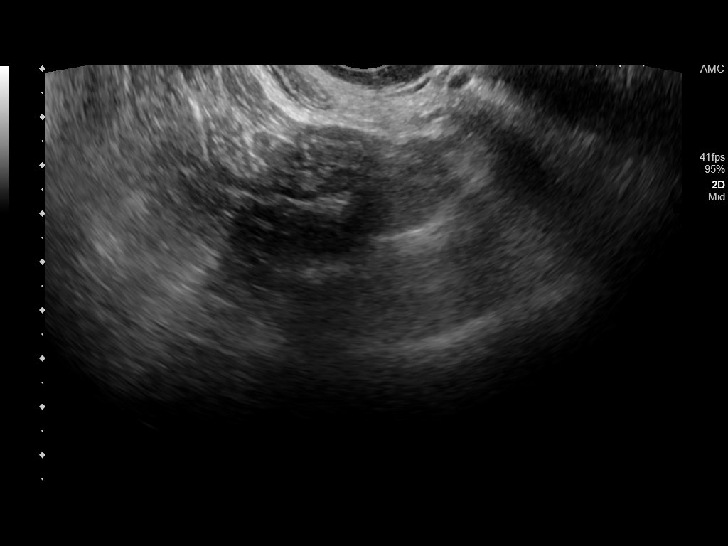

[14 of 24 positions shown; findings below may reference images not displayed]

FINDINGS: Uterus

Measurements: 6.2 x 2.6 x 4.0 cm = volume: 33 mL. Retroverted and
anteflexed. No focal uterine mass.

Endometrium

Thickness: 4 mm.  No endometrial fluid or focal abnormality

Right ovary

Not visualized on either transabdominal or endovaginal imaging,
question atrophic versus obscured by bowel

Left ovary

Not visualized on either transabdominal or endovaginal imaging,
question atrophic versus obscured by bowel

Other findings

No free pelvic fluid.  No adnexal masses.
IMPRESSION: Atrophic uterus with nonvisualization of ovaries.

No pelvic sonographic abnormalities identified.

## 2021-02-28 ENCOUNTER — Other Ambulatory Visit: Payer: Self-pay | Admitting: Gerontology

## 2021-02-28 DIAGNOSIS — R1032 Left lower quadrant pain: Secondary | ICD-10-CM

## 2021-02-28 DIAGNOSIS — R1031 Right lower quadrant pain: Secondary | ICD-10-CM

## 2021-02-28 DIAGNOSIS — R109 Unspecified abdominal pain: Secondary | ICD-10-CM

## 2021-02-28 DIAGNOSIS — R197 Diarrhea, unspecified: Secondary | ICD-10-CM

## 2021-03-08 ENCOUNTER — Ambulatory Visit
Admission: RE | Admit: 2021-03-08 | Discharge: 2021-03-08 | Disposition: A | Discharge status: Home / Self Care | Payer: BC Managed Care – PPO | Source: Ambulatory Visit | Attending: Gerontology | Admitting: Gerontology

## 2021-03-08 DIAGNOSIS — R1031 Right lower quadrant pain: Secondary | ICD-10-CM | POA: Diagnosis not present

## 2021-03-08 DIAGNOSIS — R109 Unspecified abdominal pain: Secondary | ICD-10-CM

## 2021-03-08 DIAGNOSIS — R197 Diarrhea, unspecified: Secondary | ICD-10-CM | POA: Diagnosis present

## 2021-03-08 DIAGNOSIS — R1032 Left lower quadrant pain: Secondary | ICD-10-CM | POA: Diagnosis present

## 2021-07-21 ENCOUNTER — Encounter: Admission: RE | Payer: Self-pay | Source: Home / Self Care

## 2021-07-21 ENCOUNTER — Ambulatory Visit: Admission: RE | Admit: 2021-07-21 | Payer: BC Managed Care – PPO | Source: Home / Self Care

## 2021-07-21 SURGERY — COLONOSCOPY WITH PROPOFOL
Anesthesia: General

## 2021-10-26 ENCOUNTER — Encounter: Payer: Self-pay | Admitting: *Deleted

## 2021-10-27 ENCOUNTER — Ambulatory Visit: Admission: RE | Admit: 2021-10-27 | Payer: BC Managed Care – PPO | Source: Home / Self Care

## 2021-10-27 ENCOUNTER — Encounter: Admission: RE | Payer: Self-pay | Source: Home / Self Care

## 2021-10-27 HISTORY — DX: Anogenital herpesviral infection, unspecified: A60.9

## 2021-10-27 HISTORY — DX: Dorsalgia, unspecified: M54.9

## 2021-10-27 HISTORY — DX: Vitamin D deficiency, unspecified: E55.9

## 2021-10-27 HISTORY — DX: Anxiety disorder, unspecified: F41.9

## 2021-10-27 HISTORY — DX: Depression, unspecified: F32.A

## 2021-10-27 HISTORY — DX: Allergic rhinitis, unspecified: J30.9

## 2021-10-27 HISTORY — DX: Type 2 diabetes mellitus without complications: E11.9

## 2021-10-27 SURGERY — COLONOSCOPY WITH PROPOFOL
Anesthesia: General

## 2022-06-25 ENCOUNTER — Encounter: Payer: Self-pay | Admitting: Urology

## 2022-06-25 ENCOUNTER — Ambulatory Visit: Payer: BC Managed Care – PPO | Admitting: Urology

## 2022-06-25 VITALS — BP 143/83 | HR 78 | Ht 64.0 in | Wt 180.0 lb

## 2022-06-25 DIAGNOSIS — R82998 Other abnormal findings in urine: Secondary | ICD-10-CM | POA: Diagnosis not present

## 2022-06-25 DIAGNOSIS — R300 Dysuria: Secondary | ICD-10-CM

## 2022-06-25 DIAGNOSIS — R109 Unspecified abdominal pain: Secondary | ICD-10-CM | POA: Diagnosis not present

## 2022-06-25 LAB — URINALYSIS, COMPLETE

## 2022-06-25 NOTE — Progress Notes (Signed)
I, DeAsia L Maxie,acting as a scribe for Abbie Sons, MD.,have documented all relevant documentation on the behalf of Abbie Sons, MD,as directed by  Abbie Sons, MD while in the presence of Abbie Sons, MD.   Haze Rushing Plume,acting as a scribe for Abbie Sons, MD.,have documented all relevant documentation on the behalf of Abbie Sons, MD,as directed by  Abbie Sons, MD while in the presence of Abbie Sons, MD.  06/25/2022 3:26 PM   [place] 1970-06-20 [id number]  Referring provider: Cyndie Chime, MD [address] [place],  [place] [address]  Chief Complaint  Patient presents with   Dysuria    HPI: Breanna Dennis is a 52 y.o. female referred for evaluation of dysuria.  She declined an interpreter  2 acute care visits in 05/2022 for dysuria, pelvic burning sensation, and left flank pain. UA's showed mild pyuria and urine cultures have shown no significant growth X 2. Initially treated with Ceftin and returned 2 weeks later and was treated with a course of Cipro although her urine cultures were negative. Her symptoms have since resolved. Denies dysuria or gross hematuria  PMH: Past Medical History:  Diagnosis Date   Allergic rhinitis    Anxiety    Back pain    Depression    Diabetes mellitus without complication    GERD (gastroesophageal reflux disease)    HSV (herpes simplex virus) anogenital infection    Sinusitis    Vitamin D deficiency     Surgical History: Past Surgical History:  Procedure Laterality Date   CHOLECYSTECTOMY     COLONOSCOPY     ESOPHAGOGASTRODUODENOSCOPY N/A 02/09/2015   Procedure: ESOPHAGOGASTRODUODENOSCOPY (EGD);  Surgeon: Hulen Luster, MD;  Location: [facility];  Service: Gastroenterology;  Laterality: N/A;  Spanish interpreter interpreter has been requested   FRACTURE SURGERY     KNEE SURGERY      Home Medications:  Allergies as of 06/25/2022   No Known Allergies       Medication List        Accurate as of June 25, 2022  3:26 PM. If you have any questions, ask your nurse or doctor.          cyclobenzaprine 10 MG tablet Commonly known as: FLEXERIL Take 1 tablet (10 mg total) by mouth 3 (three) times daily as needed.   ibuprofen 600 MG tablet Commonly known as: ADVIL Take 1 tablet (600 mg total) by mouth every 8 (eight) hours as needed.   multivitamin tablet Take 1 tablet by mouth daily.   omeprazole 20 MG capsule Commonly known as: PRILOSEC Take 20 mg by mouth daily. Lunch time   PROBIOTIC DAILY PO Take by mouth.        Social History:  reports that she has never smoked. She has never used smokeless tobacco. She reports that she does not drink alcohol and does not use drugs.   Physical Exam: BP (!) 143/83   Pulse 78   Ht 5\' 4"  (1.626 m)   Wt 180 lb (81.6 kg)   BMI 30.90 kg/m   Constitutional:  Alert and oriented, No acute distress. HEENT: [place] AT Respiratory: Normal respiratory effort, no increased work of breathing. Neurologic: Grossly intact, no focal deficits, moving all 4 extremities. Psychiatric: Normal mood and affect.  Laboratory Data:  Urinalysis Dipstick negative. Microscopy shows calcium oxalate crystals.   Assessment & Plan:    1. History of pelvic/flank pain  Prior UA's showed pyuria with negative cultures UA today with calcium oxalate crystals Recommend non contrast CT abdomen/pelvis for evaluation of possible stone disease All questions were answered and she desires to proceed  I have reviewed the above documentation for accuracy and completeness, and I agree with the above.   Abbie Sons, [place] [address] [place], [place] [address] [phone]

## 2022-06-26 LAB — URINALYSIS, COMPLETE
Bilirubin, UA: NEGATIVE
Glucose, UA: NEGATIVE
Ketones, UA: NEGATIVE
Leukocytes,UA: NEGATIVE
Nitrite, UA: NEGATIVE
Protein,UA: NEGATIVE
RBC, UA: NEGATIVE
Specific Gravity, UA: >1.03 — ABNORMAL HIGH (ref 1.005–1.030)
Urobilinogen, Ur: 0.2 mg/dL (ref 0.2–1.0)
pH, UA: 5 (ref 5.0–7.5)

## 2022-06-26 LAB — MICROSCOPIC EXAMINATION

## 2022-07-02 ENCOUNTER — Ambulatory Visit
Admission: RE | Admit: 2022-07-02 | Discharge: 2022-07-02 | Disposition: A | Discharge status: Home / Self Care | Payer: BC Managed Care – PPO | Source: Ambulatory Visit | Attending: Urology | Admitting: Urology

## 2022-07-02 DIAGNOSIS — R109 Unspecified abdominal pain: Secondary | ICD-10-CM | POA: Insufficient documentation

## 2022-08-23 ENCOUNTER — Other Ambulatory Visit: Payer: Self-pay | Admitting: Gerontology

## 2022-08-23 DIAGNOSIS — Z1231 Encounter for screening mammogram for malignant neoplasm of breast: Secondary | ICD-10-CM

## 2023-11-07 ENCOUNTER — Emergency Department
Admission: EM | Admit: 2023-11-07 | Discharge: 2023-11-07 | Disposition: A | Discharge status: Home / Self Care | Attending: Emergency Medicine | Admitting: Emergency Medicine

## 2023-11-07 ENCOUNTER — Emergency Department

## 2023-11-07 ENCOUNTER — Other Ambulatory Visit: Payer: Self-pay

## 2023-11-07 DIAGNOSIS — W1830XA Fall on same level, unspecified, initial encounter: Secondary | ICD-10-CM | POA: Diagnosis not present

## 2023-11-07 DIAGNOSIS — S0083XA Contusion of other part of head, initial encounter: Secondary | ICD-10-CM | POA: Insufficient documentation

## 2023-11-07 DIAGNOSIS — S0990XA Unspecified injury of head, initial encounter: Secondary | ICD-10-CM

## 2023-11-07 DIAGNOSIS — R519 Headache, unspecified: Secondary | ICD-10-CM | POA: Diagnosis present

## 2023-11-07 DIAGNOSIS — M25532 Pain in left wrist: Secondary | ICD-10-CM | POA: Insufficient documentation

## 2023-11-07 DIAGNOSIS — M79601 Pain in right arm: Secondary | ICD-10-CM | POA: Diagnosis not present

## 2023-11-07 DIAGNOSIS — W19XXXA Unspecified fall, initial encounter: Secondary | ICD-10-CM

## 2023-11-07 DIAGNOSIS — E119 Type 2 diabetes mellitus without complications: Secondary | ICD-10-CM | POA: Insufficient documentation

## 2023-11-07 MED ORDER — ACETAMINOPHEN 500 MG PO TABS
1000.0000 mg | ORAL_TABLET | Freq: Once | ORAL | Status: AC
  Administered 2023-11-07: 1000 mg via ORAL
  Filled 2023-11-07: qty 2

## 2023-11-07 MED ORDER — IBUPROFEN 600 MG PO TABS
600.0000 mg | ORAL_TABLET | Freq: Once | ORAL | Status: AC
  Administered 2023-11-07: 600 mg via ORAL
  Filled 2023-11-07: qty 1

## 2023-11-07 MED ORDER — NAPROXEN 500 MG PO TABS: 500.0000 mg | ORAL_TABLET | Freq: Two times a day (BID) | ORAL | 0 refills | Status: AC

## 2023-11-07 NOTE — ED Triage Notes (Signed)
 First nurse note: Pt to ED via POV from [facility]. Pt reports fell and hit face on 8/11. Pt reports HA and right sided facial pain.

## 2023-11-07 NOTE — Discharge Instructions (Addendum)
 You have been seen in the Emergency Department (ED) today for a fall.  Your work up does not show any concerning injuries.  Please take any prescribed medication as instructed.   Please follow up with your doctor regarding today's Emergency Department (ED) visit and your recent fall.    Return to the ED if you have any worsening headache, confusion, slurred speech, weakness/numbness of any arm or leg, or any increased pain or any new or concerning symptom.

## 2023-11-07 NOTE — ED Triage Notes (Signed)
 Refer to first nurse note.

## 2023-11-07 NOTE — ED Provider Notes (Signed)
 [facility] Provider Note    Event Date/Time   First MD Initiated Contact with Patient 11/07/23 1737     (approximate)   History   Head Injury   HPI  Breanna Dennis is a 53 y.o. female  with a past medical history of depression, anxiety, diabetes, sinusitis, allergic rhinitis presents to the emergency department with headache and right-sided facial pain x 3 days.  She also has pain in her right arm and left wrist.  Patient states she had a fall on Monday while carrying fertilizer and 1 hand and the knife and the other hand attempting to watering her tomato plants.  Patient states she hit her right frontal head region during the fall.  She also reports pain surrounding her right eye that is now progressing to her left, but no blurry vision or vision changes.  Denies vomiting. She has had 1 episode of dizziness today and 3 episodes of dizziness yesterday. Has not taken anything at home for pain.     Physical Exam   Triage Vital Signs: ED Triage Vitals  Encounter Vitals Group     BP 11/07/23 1649 (!) 155/92     Girls Systolic BP Percentile --      Girls Diastolic BP Percentile --      Boys Systolic BP Percentile --      Boys Diastolic BP Percentile --      Pulse Rate 11/07/23 1649 78     Resp 11/07/23 1649 17     Temp 11/07/23 1651 98 F (36.7 C)     Temp Source 11/07/23 1649 Oral     SpO2 11/07/23 1649 98 %     Weight 11/07/23 1649 185 lb (83.9 kg)     Height 11/07/23 1649 5' 4 (1.626 m)     Head Circumference --      Peak Flow --      Pain Score 11/07/23 1649 8     Pain Loc --      Pain Education --      Exclude from Growth Chart --     Most recent vital signs: Vitals:   11/07/23 1837 11/07/23 1947  BP:  128/77  Pulse:  70  Resp:  18  Temp:  98.4 F (36.9 C)  SpO2: 98% 96%    General: Awake, in no acute distress. Appears stated age. Head: Normocephalic, atraumatic. Eyes: PERRLA. EOMs intact. No scleral icterus or  conjunctival injection. Ears/Nose/Throat: TMs intact b/l. Nares patent, no nasal discharge. Oropharynx moist, no erythema or exudate. Dentition intact. Neck: Supple, no lymphadenopathy, no JVD, no nuchal rigidity. CV: Regular rate, 78 bpm. Peripheral pulses 2+ and symmetric. No edema. Respiratory: No respiratory distress. Normal respiratory effort. GI: Soft, non-distended, non-tender. No rebound or guarding.  MSK: Normal ROM and  5/5 strength in bilateral upper and lower extremities. Skin:Warm, dry, intact. No rashes, lesions, or ecchymosis. No cyanosis or pallor. Neurological: A&Ox4 to person, place, time, and situation. Cranial nerves II-XII grossly intact. Sensation intact. Strength symmetric. No focal deficits.   ED Results / Procedures / Treatments   Labs (all labs ordered are listed, but only abnormal results are displayed) Labs Reviewed - No data to display   EKG     RADIOLOGY FINDINGS: CT HEAD FINDINGS   Brain: No evidence of acute infarction, hemorrhage, hydrocephalus, extra-axial collection or mass lesion/mass effect.   Vascular: No hyperdense vessel or unexpected calcification.   Skull: Normal. Negative for fracture or focal lesion.  Other: None.   CT MAXILLOFACIAL FINDINGS   Osseous: No fracture or mandibular dislocation. No destructive process.   Orbits: Negative. No traumatic or inflammatory finding.   Sinuses: Clear.   Soft tissues: Surrounding soft tissue structures show mild swelling over the chin without focal hematoma.   CT CERVICAL SPINE FINDINGS   Alignment: Mild straightening of the normal cervical lordosis is noted.   Skull base and vertebrae: 7 cervical segments are well visualized. Vertebral body height is well maintained. Mild osteophytic changes are noted. No acute fracture or acute facet abnormality is noted.   Soft tissues and spinal canal: Surrounding soft tissue structures are within normal limits.   Upper chest: Visualized  lung apices are unremarkable.   Other: None  IMPRESSION: CT of the head: No acute intracranial abnormality noted.   CT of cervical spine: Mild straightening of the normal cervical lordosis.   No acute bony abnormality is noted.   CT of the maxillofacial bones: Mild soft tissue swelling over the chin. No acute bony abnormality is noted.   PROCEDURES:  Critical Care performed: No   Procedures   MEDICATIONS ORDERED IN ED: Medications  acetaminophen  (TYLENOL ) tablet 1,000 mg (1,000 mg Oral Given 11/07/23 2047)  ibuprofen  (ADVIL ) tablet 600 mg (600 mg Oral Given 11/07/23 2047)     IMPRESSION / MDM / ASSESSMENT AND PLAN / ED COURSE  I reviewed the triage vital signs and the nursing notes.                              Differential diagnosis includes, but is not limited to, mechanical fall, minor head injury, facial or skull fracture, migraine, optic neuritis  Patient's presentation is most consistent with acute complicated illness / injury requiring diagnostic workup.  CT of the head, cervical spine, and maxillofacial regions were without any acute bony abnormalities.  There is mild soft tissue swelling noted over the chin, likely where the patient hit her face when she fell 3 days ago.  I did provide her with 1 dose of ibuprofen  and Tylenol  here in the emergency department.  She is not having any acute vision changes at this time and all vital signs are within normal range.  She is well-appearing.  I did provide her with a prescription for naproxen  as the think this may be unrelated to her head injury and may be consistent with a migraine presentation.  I would like her to follow-up with her primary care provider regarding today's visit or in the emergency department for any new, worse, or concerning symptoms.  Patient was given the opportunity to ask questions; all questions were answered. Emergency department return precautions were discussed with the patient.  Patient is in  agreement to the treatment plan.  Patient is stable for discharge.    FINAL CLINICAL IMPRESSION(S) / ED DIAGNOSES   Final diagnoses:  Fall, initial encounter  Injury of head, initial encounter  Chin contusion, initial encounter     Rx / DC Orders   ED Discharge Orders          Ordered    naproxen  (NAPROSYN ) 500 MG tablet  2 times daily with meals        11/07/23 2039             Note:  This document was prepared using Dragon voice recognition software and may include unintentional dictation errors.     Sheron La Pryor, [place] 11/07/23 2119  Jacolyn Pae, MD 11/09/23 1146
